# Patient Record
Sex: Male | Born: 2000
Health system: Southern US, Community
[De-identification: ages and names within clinical notes are randomized; demographics above are authoritative.]

---
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
## 2022-01-10 ENCOUNTER — Other Ambulatory Visit: Payer: Self-pay

## 2022-01-10 ENCOUNTER — Encounter (HOSPITAL_COMMUNITY): Payer: Self-pay

## 2022-01-10 ENCOUNTER — Emergency Department (HOSPITAL_COMMUNITY)
Admission: EM | Admit: 2022-01-10 | Discharge: 2022-01-10 | Disposition: A | Discharge status: Home / Self Care | Payer: No Typology Code available for payment source | Attending: Emergency Medicine | Admitting: Emergency Medicine

## 2022-01-10 DIAGNOSIS — R519 Headache, unspecified: Secondary | ICD-10-CM | POA: Diagnosis not present

## 2022-01-10 DIAGNOSIS — R509 Fever, unspecified: Secondary | ICD-10-CM | POA: Insufficient documentation

## 2022-01-10 DIAGNOSIS — M791 Myalgia, unspecified site: Secondary | ICD-10-CM | POA: Diagnosis not present

## 2022-01-10 DIAGNOSIS — Z20822 Contact with and (suspected) exposure to covid-19: Secondary | ICD-10-CM | POA: Insufficient documentation

## 2022-01-10 DIAGNOSIS — R059 Cough, unspecified: Secondary | ICD-10-CM | POA: Insufficient documentation

## 2022-01-10 DIAGNOSIS — R6889 Other general symptoms and signs: Secondary | ICD-10-CM

## 2022-01-10 LAB — SARS CORONAVIRUS 2 BY RT PCR: SARS Coronavirus 2 by RT PCR: NEGATIVE

## 2022-01-10 MED ORDER — ACETAMINOPHEN 500 MG PO TABS
1000.0000 mg | ORAL_TABLET | Freq: Once | ORAL | Status: AC
  Administered 2022-01-10: 1000 mg via ORAL
  Filled 2022-01-10: qty 2

## 2022-01-10 NOTE — Discharge Instructions (Addendum)
You were seen in the emergency department today for fever and headache.  Make sure that you are drinking lots of fluids and getting plenty of rest. You can take decongestants as long as you take them with lots of water.   Please use Tylenol or ibuprofen for fever/pain.  You may use 800 mg ibuprofen every 6 hours or 1000 mg of Tylenol every 6 hours.  You may choose to alternate between the 2.  This would be most effective.  Do not exceed 4 g of Tylenol within 24 hours.  Do not exceed 3200 mg ibuprofen within 24 hours.  Example: 8:30pm - 1000mg  tylenol 11:30pm - 800mg  ibuprofen 2:30am - 1000mg  tylenol 5:30am - 800mg  ibuprofen  Continue to monitor how you are doing, and return to the emergency department for new or worsening symptoms such as chest pain, difficulty breathing not related to coughing, fever despite medication, or persistent vomiting or diarrhea.

## 2022-01-10 NOTE — ED Provider Notes (Signed)
[facility] DEPT Provider Note   CSN: [id number] Arrival date & time: 01/10/22  1933     History  Chief Complaint  Patient presents with   Fever   Generalized Body Aches   Chills    Danny Best is a 21 y.o. male who presents emergency department complaining of fever, generalized body aches, and headache for the past 2 days.  Mother notes patient had a temperature of 102.27F prior to ER arrival.  Patient works at [employer], and states that he was working outside for several hours without a jacket today.  Believes that this made his symptoms worse.  Mother was giving him ibuprofen and Tylenol, as well as Mucinex.  Patient reports minimally productive cough, no shortness of breath.  No nausea, vomiting, abdominal pain, diarrhea.   Fever Associated symptoms: cough and headaches   Associated symptoms: no chest pain, no diarrhea, no nausea and no vomiting       Home Medications Prior to Admission medications   Medication Sig Start Date End Date Taking? Authorizing Provider  flintstones complete (FLINTSTONES) 60 MG chewable tablet Chew 1 tablet by mouth daily.    [provider]  ibuprofen (ADVIL,MOTRIN) 100 MG/5ML suspension Take 5 mg/kg by mouth every 6 (six) hours as needed.    [provider]  Multiple Vitamin (DAILY VITAMINS PO) Take by mouth.    [provider]  ondansetron (ZOFRAN ODT) 4 MG disintegrating tablet Take 1 tablet (4 mg total) by mouth every 8 (eight) hours as needed for nausea or vomiting. 09/23/13   Antonietta Breach, PA-C      Allergies    Patient has no known allergies.    Review of Systems   Review of Systems  Constitutional:  Positive for fever.  Eyes:  Negative for visual disturbance.  Respiratory:  Positive for cough. Negative for shortness of breath.   Cardiovascular:  Negative for chest pain.  Gastrointestinal:  Negative for abdominal pain, diarrhea, nausea and vomiting.  Neurological:  Positive for  headaches. Negative for syncope.  All other systems reviewed and are negative.  Physical Exam Updated Vital Signs BP 115/60   Pulse (!) 111   Temp (!) 102.1 F (38.9 C) (Oral)   Resp 17   Ht 6\' 2"  (1.88 m)   Wt 79.5 kg   SpO2 94%   BMI 22.51 kg/m  Physical Exam Vitals and nursing note reviewed.  Constitutional:      Appearance: Normal appearance. He is not toxic-appearing.  HENT:     Head: Normocephalic and atraumatic.  Eyes:     Conjunctiva/sclera: Conjunctivae normal.  Cardiovascular:     Rate and Rhythm: Normal rate and regular rhythm.  Pulmonary:     Effort: Pulmonary effort is normal. No respiratory distress.     Breath sounds: Normal breath sounds.  Abdominal:     General: There is no distension.     Palpations: Abdomen is soft.     Tenderness: There is no abdominal tenderness.  Musculoskeletal:     Cervical back: Normal range of motion. No rigidity.  Skin:    General: Skin is warm and dry.  Neurological:     General: No focal deficit present.     Mental Status: He is alert.  Psychiatric:        Mood and Affect: Mood normal.        Behavior: Behavior normal.    ED Results / Procedures / Treatments   Labs (all labs ordered are listed, but only  abnormal results are displayed) Labs Reviewed  SARS CORONAVIRUS 2 BY RT PCR    EKG None  Radiology No results found.  Procedures Procedures    Medications Ordered in ED Medications  acetaminophen (TYLENOL) tablet 1,000 mg (1,000 mg Oral Given 01/10/22 2005)    ED Course/ Medical Decision Making/ A&P                           Medical Decision Making Risk OTC drugs.  This patient is a 21 y.o. male  who presents to the ED for concern of headache and fever.   Differential diagnoses prior to evaluation: The emergent differential diagnosis includes, but is not limited to,  Upper respiratory infection, acute sinusitis, acute otitis media, strep pharyngitis, bronchiolitis/RSV, influenza, COVID, pneumonia,  appendicitis, meningitis. This is not an exhaustive differential.   Past Medical History / Co-morbidities: History reviewed. No pertinent past medical history.  Physical Exam: Physical exam performed. The pertinent findings include: Febrile to 102.27F, given Tylenol.  Tachycardic to 111.  Normal oxygen saturation.  Lung sounds clear.  Patient does not appear toxic.  Lab Tests/Imaging studies: I Ordered, and personally interpreted labs/imaging including COVID test.  The pertinent results include: Negative for COVID.    Medications: I ordered medication including Tylenol for fever.  I have reviewed the patients home medicines and have made adjustments as needed.   Disposition: After consideration of the diagnostic results and the patients response to treatment, I feel that patient's not requiring admission or inpatient treatment for symptoms.  His symptoms are likely related to a viral illness.  He tested negative for COVID.  Low suspicion for meningitis as he is clinically well appearing, full range of motion of neck without stiffness or pain.  Will recommend symptomatic management with over-the-counter medications.  Discussed reasons to return to the emergency department, and the mother is agreeable to the plan.         Final Clinical Impression(s) / ED Diagnoses Final diagnoses:  Flu-like symptoms  Fever, unspecified fever cause    Rx / DC Orders ED Discharge Orders     None      Portions of this report may have been transcribed using voice recognition software. Every effort was made to ensure accuracy; however, inadvertent computerized transcription errors may be present.    Estill Cotta 01/10/22 2307    Daleen Bo, MD 01/10/22 2352

## 2022-01-10 NOTE — ED Triage Notes (Addendum)
Pt reports with fever, generalized body aches, and fever x 2 days. Pt last took night time Mucinex 1.5 hrs ago.

## 2023-08-15 ENCOUNTER — Telehealth: Payer: Self-pay

## 2023-08-15 ENCOUNTER — Ambulatory Visit
Admission: EM | Admit: 2023-08-15 | Discharge: 2023-08-15 | Disposition: A | Discharge status: Home / Self Care | Payer: BC Managed Care – PPO | Attending: Physician Assistant | Admitting: Physician Assistant

## 2023-08-15 DIAGNOSIS — K047 Periapical abscess without sinus: Secondary | ICD-10-CM | POA: Diagnosis not present

## 2023-08-15 DIAGNOSIS — R220 Localized swelling, mass and lump, head: Secondary | ICD-10-CM | POA: Diagnosis not present

## 2023-08-15 MED ORDER — IBUPROFEN 600 MG PO TABS: 600.0000 mg | ORAL_TABLET | Freq: Three times a day (TID) | ORAL | 0 refills | Status: DC | PRN

## 2023-08-15 MED ORDER — AMOXICILLIN-POT CLAVULANATE 875-125 MG PO TABS: 1.0000 | ORAL_TABLET | Freq: Two times a day (BID) | ORAL | 0 refills | Status: DC

## 2023-08-15 NOTE — Discharge Instructions (Signed)
Start Augmentin twice daily for 7 days.  Use ibuprofen 600 mg up to 3 times a day for pain relief.  You should avoid NSAIDs including aspirin, ibuprofen/Advil, naproxen/Aleve with this medication as it causes stomach bleeding.  You can use Tylenol for breakthrough pain.  Gargle with warm salt water for additional symptom relief.  You should follow-up with dentist; call to schedule an appointment.  If you develop any worsening symptoms including difficulty swallowing, difficulty speaking, swelling of your throat, high fever, change in your voice you need to be seen immediately.

## 2023-08-15 NOTE — ED Triage Notes (Signed)
Patient reports right sided facial swelling that he noticed this morning. States there is a dull pain but he just wanted to make sure it wasn't an infection.

## 2023-08-15 NOTE — ED Provider Notes (Signed)
[facility]    CSN: [id number] Arrival date & time: 08/15/23  4098      History   Chief Complaint Chief Complaint  Patient presents with   Facial Swelling    HPI Danny Best is a 22 y.o. male.   Patient presents today companied by his mother help provide the majority of history.  Reports he woke up today and noticed swelling on his right lower jaw.  He reports some discomfort in this area that is rated 3/4 on a 0-10 pain scale but denies any significant pain.  He denies any difficulty with mastication, difficulty opening his mouth, sore throat, fever, change to saliva production.  He has not tried any over-the-counter medication.  Denies any recent dental procedure.  He is eating and drinking normally.  He was treated with azithromycin for pneumonia a few months ago but denies additional antibiotics since that time.  He denies any swelling of his throat, shortness of breath, muffled voice.  He reports being up-to-date on his childhood vaccinations.  No concern for HIV exposure.    History reviewed. No pertinent past medical history.  There are no active problems to display for this patient.   History reviewed. No pertinent surgical history.     Home Medications    Prior to Admission medications   Medication Sig Start Date End Date Taking? Authorizing Provider  amoxicillin-clavulanate (AUGMENTIN) 875-125 MG tablet Take 1 tablet by mouth every 12 (twelve) hours. 08/15/23  Yes Saw Mendenhall K, PA-C  ibuprofen (ADVIL) 600 MG tablet Take 1 tablet (600 mg total) by mouth every 8 (eight) hours as needed. 08/15/23  Yes Erubiel Manasco, Noberto Retort, PA-C    Family History Family History  Problem Relation Age of Onset   Healthy Mother    Healthy Father     Social History Social History   Tobacco Use   Smoking status: Never   Smokeless tobacco: Never  Vaping Use   Vaping status: Never Used  Substance Use Topics   Alcohol use: No   Drug use: No     Allergies    Patient has no known allergies.   Review of Systems Review of Systems  Constitutional:  Positive for activity change. Negative for appetite change, fatigue and fever.  HENT:  Positive for facial swelling. Negative for congestion, dental problem, sinus pressure, sneezing, sore throat, trouble swallowing and voice change.   Respiratory:  Negative for cough (Improving since pneumonia) and shortness of breath.   Cardiovascular:  Negative for chest pain.  Gastrointestinal:  Negative for abdominal pain, diarrhea, nausea and vomiting.     Physical Exam Triage Vital Signs ED Triage Vitals [08/15/23 1113]  Encounter Vitals Group     BP 112/62     Systolic BP Percentile      Diastolic BP Percentile      Pulse Rate 83     Resp 16     Temp 98.2 F (36.8 C)     Temp Source Oral     SpO2 98 %     Weight      Height      Head Circumference      Peak Flow      Pain Score 2     Pain Loc      Pain Education      Exclude from Growth Chart    No data found.  Updated Vital Signs BP 112/62 (BP Location: Right Arm)   Pulse 83   Temp 98.2 F (36.8 C) (  Oral)   Resp 16   SpO2 98%   Visual Acuity Right Eye Distance:   Left Eye Distance:   Bilateral Distance:    Right Eye Near:   Left Eye Near:    Bilateral Near:     Physical Exam Vitals reviewed.  Constitutional:      General: He is awake.     Appearance: Normal appearance. He is well-developed. He is not ill-appearing.     Comments: Very pleasant male appears stated age in no acute distress sitting comfortably in exam room  HENT:     Head: Normocephalic and atraumatic.     Jaw: Swelling present.      Right Ear: Tympanic membrane, ear canal and external ear normal. Tympanic membrane is not erythematous or bulging.     Left Ear: Tympanic membrane, ear canal and external ear normal. Tympanic membrane is not erythematous or bulging.     Nose: Nose normal.     Mouth/Throat:     Dentition: Gingival swelling present. No dental  abscesses.     Pharynx: Uvula midline. No oropharyngeal exudate, posterior oropharyngeal erythema or uvula swelling.     Comments: No evidence of Ludwig angina Cardiovascular:     Rate and Rhythm: Normal rate and regular rhythm.     Heart sounds: Normal heart sounds, S1 normal and S2 normal. No murmur heard. Pulmonary:     Effort: Pulmonary effort is normal. No accessory muscle usage or respiratory distress.     Breath sounds: Normal breath sounds. No stridor. No wheezing, rhonchi or rales.     Comments: Clear to auscultation bilaterally Neurological:     Mental Status: He is alert.  Psychiatric:        Behavior: Behavior is cooperative.      UC Treatments / Results  Labs (all labs ordered are listed, but only abnormal results are displayed) Labs Reviewed - No data to display  EKG   Radiology No results found.  Procedures Procedures (including critical care time)  Medications Ordered in UC Medications - No data to display  Initial Impression / Assessment and Plan / UC Course  I have reviewed the triage vital signs and the nursing notes.  Pertinent labs & imaging results that were available during my care of the patient were reviewed by me and considered in my medical decision making (see chart for details).     Patient is well-appearing, afebrile, nontoxic, nontachycardic.  Concern for dental infection given clinical presentation.  Patient was started on Augmentin twice daily for 7 days.  He was given ibuprofen 600 mg to be taken up to 3 times a day as needed with instruction not to take NSAIDs with this medication due to risk of GI bleeding.  He can use warm compresses on this area.  Recommended follow-up with a dentist and he was given the contact information for local provider.  Discussed that if anything worsens or changes he needs to be seen immediately including swelling of his throat, shortness of breath, dysphagia, muffled voice.  Strict return precautions given.   Excuse note provided.  Final Clinical Impressions(s) / UC Diagnoses   Final diagnoses:  Dental infection  Swelling of right side of face     Discharge Instructions      Start Augmentin twice daily for 7 days.  Use ibuprofen 600 mg up to 3 times a day for pain relief.  You should avoid NSAIDs including aspirin, ibuprofen/Advil, naproxen/Aleve with this medication as it causes stomach bleeding.  You  can use Tylenol for breakthrough pain.  Gargle with warm salt water for additional symptom relief.  You should follow-up with dentist; call to schedule an appointment.  If you develop any worsening symptoms including difficulty swallowing, difficulty speaking, swelling of your throat, high fever, change in your voice you need to be seen immediately.      ED Prescriptions     Medication Sig Dispense Auth. Provider   amoxicillin-clavulanate (AUGMENTIN) 875-125 MG tablet Take 1 tablet by mouth every 12 (twelve) hours. 14 tablet Adely Facer K, PA-C   ibuprofen (ADVIL) 600 MG tablet Take 1 tablet (600 mg total) by mouth every 8 (eight) hours as needed. 30 tablet Rodric Punch, Noberto Retort, PA-C      PDMP not reviewed this encounter.   Jeani Hawking, PA-C 08/15/23 1139

## 2023-10-10 ENCOUNTER — Ambulatory Visit (HOSPITAL_COMMUNITY)
Admission: EM | Admit: 2023-10-10 | Discharge: 2023-10-10 | Disposition: A | Discharge status: Home / Self Care | Payer: BC Managed Care – PPO

## 2024-01-24 ENCOUNTER — Ambulatory Visit
Admission: RE | Admit: 2024-01-24 | Discharge: 2024-01-24 | Disposition: A | Discharge status: Home / Self Care | Source: Ambulatory Visit | Attending: Student in an Organized Health Care Education/Training Program | Admitting: Student in an Organized Health Care Education/Training Program

## 2024-01-24 ENCOUNTER — Ambulatory Visit: Admitting: Student in an Organized Health Care Education/Training Program

## 2024-01-24 ENCOUNTER — Encounter: Payer: Self-pay | Admitting: Student in an Organized Health Care Education/Training Program

## 2024-01-24 VITALS — BP 120/80 | HR 71 | Temp 98.9°F | Ht 73.5 in | Wt 183.4 lb

## 2024-01-24 DIAGNOSIS — R059 Cough, unspecified: Secondary | ICD-10-CM

## 2024-01-24 DIAGNOSIS — R053 Chronic cough: Secondary | ICD-10-CM

## 2024-01-24 DIAGNOSIS — R0602 Shortness of breath: Secondary | ICD-10-CM

## 2024-01-24 LAB — NITRIC OXIDE: Nitric Oxide: 28

## 2024-01-24 NOTE — Progress Notes (Unsigned)
 Synopsis: Referred in for cough by Lyman Sander, MD  Assessment & Plan:   #Cough  Presents for the evaluation of a mild cough with a recent history of a few URTI's, one of which was treated with azithromycin. He has a normal physical exam today. In light of reported history of childhood asthma, we obtained a FENO in clinic today that was indeterminate at 28 ppb. We will need to obtain PFT's to further work him up for cough variant asthma. His only other risk factor includes the presence of a pet parrot at home, which does raise the spectre of hypersensitivity pneumonitis.   I will obtain a CXR today to rule out pneumonia and pneumothorax. I will also review his PFT's (specifically DLCO) for any signs of reduced diffusion capacity once obtained. Should there be any signal of abnormality on PFT's, I will proceed with a high resolution chest CT to further evaluate the lung parenchyma.  - Nitric oxide > Indeterminate at 28 ppb - Pulmonary Function Test; Future - DG Chest 2 View; Future > reviewed, no acute findings - recommended to avoid the parrot at home  Return in about 4 weeks (around 02/21/2024).  I spent 60 minutes caring for this patient today, including preparing to see the patient, obtaining a medical history , reviewing a separately obtained history, performing a medically appropriate examination and/or evaluation, counseling and educating the patient/family/caregiver, ordering medications, tests, or procedures, documenting clinical information in the electronic health record, and independently interpreting results (not separately reported/billed) and communicating results to the patient/family/caregiver  Vergia Glasgow, MD [facility]  End of visit medications:  No orders of the defined types were placed in this encounter.   No current outpatient medications on file.   Subjective:   PATIENT ID: Danny Best GENDER: male DOB: May 20, 2001, MRN:  [id number]  Chief Complaint  Patient presents with   New Patient (Initial Visit)    Has had a lung condition for about 2-3 yrs, had had multiple lung infections -- flu, Covid, pneumonia, bronchitis. Has a wet cough with yellow phlegm, intermittent wheezing. Has had x-ray's in the past, andf has tried an inhaler but that wasn't helpful at the time. Is not currently SOB/DOE but can get winded or tried during sports. Remote history of childhood asthma.     HPI  Patient is a pleasant 23 year old male presenting to clinic for the evaluation of a mild cough.  He reports a mild cough that is mostly nocturnal without any sputum production.  This is a new symptom for him and has not been present in the past.  He does not have any other associated pulmonary symptoms and specifically denies exertional dyspnea or any limitation to his activity.  He does tell me that he had some exertional dyspnea growing up as a child but this self resolved.  He does not have any chest pain or chest tightness, denies any fevers, chills, night sweats, or signs of systemic illness. He is able to exercise without any limitations.  Patient was told he has asthma as a child but did not use any inhalers.  He was told he outgrew it.  Review of the medical record is notable for a visit with pediatrics in October 2020 for where he was treated with a course of azithromycin for pneumonia.  He was then at again seen in clinic in February 2025 for symptoms that were consistent with an upper respiratory tract infection that was treated conservatively. There is mention of recurrent  URTI's in the chart.  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  They have a family dog as well as a family parent.  The parent is in the living room and his exposure to it is minimal.  He denies any smoking history and denies using any vapes.  He does not have any occupational exposures.  He is aspiring to be a Advertising account executive.  Ancillary information including prior medications, full medical/surgical/family/social histories, and PFTs (when available) are listed below and have been reviewed.   Review of Systems  Constitutional:  Negative for chills and fever.  Respiratory:  Positive for cough. Negative for hemoptysis, sputum production, shortness of breath and wheezing.   Cardiovascular:  Negative for chest pain.     Objective:   Vitals:   01/24/24 1519  BP: 120/80  Pulse: 71  Temp: 98.9 F (37.2 C)  TempSrc: Oral  SpO2: 98%  Weight: 183 lb 6.4 oz (83.2 kg)  Height: 6' 1.5 (1.867 m)   98% on RA BMI Readings from Last 3 Encounters:  01/24/24 23.87 kg/m  01/10/22 22.51 kg/m (55%, Z= 0.12)*   * Growth percentiles are based on CDC (Boys, 2-20 Years) data.   Wt Readings from Last 3 Encounters:  01/24/24 183 lb 6.4 oz (83.2 kg)  01/10/22 175 lb 4.8 oz (79.5 kg) (81%, Z= 0.88)*  10/27/20 154 lb 8 oz (70.1 kg) (66%, Z= 0.41)*   * Growth percentiles are based on CDC (Boys, 2-20 Years) data.    Physical Exam Constitutional:      Appearance: Normal appearance.  HENT:     Head: Normocephalic and atraumatic.  Cardiovascular:     Rate and Rhythm: Normal rate and regular rhythm.     Pulses: Normal pulses.     Heart sounds: Normal heart sounds.  Pulmonary:     Effort: Pulmonary effort is normal. No respiratory distress.     Breath sounds: Normal breath sounds. No wheezing, rhonchi or rales.  Musculoskeletal:     Cervical back: Normal range of motion.  Neurological:     General: No focal deficit present.     Mental Status: He is alert and oriented to person, place, and time. Mental status is at baseline.       Ancillary Information    History reviewed. No pertinent past medical history.   Family History  Problem Relation Age of Onset   Healthy Mother    Healthy Father      History reviewed. No pertinent surgical history.  Social History   Socioeconomic History   Marital  status: Single    Spouse name: Not on file   Number of children: Not on file   Years of education: Not on file   Highest education level: Not on file  Occupational History   Not on file  Tobacco Use   Smoking status: Never   Smokeless tobacco: Never  Vaping Use   Vaping status: Never Used  Substance and Sexual Activity   Alcohol use: No   Drug use: No   Sexual activity: Not on file  Other Topics Concern   Not on file  Social History Narrative   Not on file   Social Drivers of Health   Financial Resource Strain: Not on file  Food Insecurity: Low Risk  (10/19/2023)   Received from [facility]   Hunger Vital Sign    Worried About Running Out of Food in the Last Year: Never true  Ran Out of Food in the Last Year: Never true  Transportation Needs: No Transportation Needs (10/19/2023)   Received from [facility]    In the past 12 months, has lack of reliable transportation kept you from medical appointments, meetings, work or from getting things needed for daily living? : No  Physical Activity: Not on file  Stress: Not on file  Social Connections: Not on file  Intimate Partner Violence: Not on file     No Known Allergies   CBC No results found for: WBC, RBC, HGB, HCT, PLT, MCV, MCH, MCHC, RDW, LYMPHSABS, MONOABS, EOSABS, BASOSABS  Pulmonary Functions Testing Results:     No data to display          Outpatient Medications Prior to Visit  Medication Sig Dispense Refill   amoxicillin -clavulanate (AUGMENTIN ) 875-125 MG tablet Take 1 tablet by mouth every 12 (twelve) hours. 14 tablet 0   ibuprofen  (ADVIL ) 600 MG tablet Take 1 tablet (600 mg total) by mouth every 8 (eight) hours as needed. 30 tablet 0   No facility-administered medications prior to visit.

## 2024-01-25 ENCOUNTER — Ambulatory Visit: Payer: Self-pay | Admitting: Student in an Organized Health Care Education/Training Program

## 2024-01-25 NOTE — Telephone Encounter (Signed)
 Copied from CRM [id number]. Topic: Clinical - Lab/Test Results >> Jan 25, 2024 12:13 PM Danny Best wrote: Reason for CRM: Patient would like to discuss his xray results, has additional questions.

## 2024-02-16 ENCOUNTER — Encounter

## 2024-02-23 ENCOUNTER — Ambulatory Visit: Admitting: Student in an Organized Health Care Education/Training Program

## 2024-03-15 ENCOUNTER — Ambulatory Visit: Admitting: Student in an Organized Health Care Education/Training Program

## 2024-05-15 ENCOUNTER — Ambulatory Visit: Admitting: Student in an Organized Health Care Education/Training Program

## 2024-05-15 ENCOUNTER — Encounter: Payer: Self-pay | Admitting: Student in an Organized Health Care Education/Training Program

## 2024-05-15 VITALS — BP 132/82 | HR 86 | Temp 97.6°F | Ht 73.5 in | Wt 187.0 lb

## 2024-05-15 DIAGNOSIS — R0602 Shortness of breath: Secondary | ICD-10-CM

## 2024-05-15 DIAGNOSIS — R053 Chronic cough: Secondary | ICD-10-CM | POA: Diagnosis not present

## 2024-05-15 LAB — PULMONARY FUNCTION TEST
DL/VA % pred: 108 %
DL/VA: 5.62 ml/min/mmHg/L
DLCO unc % pred: 85 %
DLCO unc: 31.37 ml/min/mmHg
FEF 25-75 Post: 5.88 L/s
FEF 25-75 Pre: 4.93 L/s
FEF2575-%Change-Post: 19 %
FEF2575-%Pred-Post: 110 %
FEF2575-%Pred-Pre: 92 %
FEV1-%Change-Post: 3 %
FEV1-%Pred-Post: 84 %
FEV1-%Pred-Pre: 81 %
FEV1-Post: 4.36 L
FEV1-Pre: 4.21 L
FEV1FVC-%Change-Post: 3 %
FEV1FVC-%Pred-Pre: 100 %
FEV6-%Change-Post: 0 %
FEV6-%Pred-Post: 80 %
FEV6-%Pred-Pre: 80 %
FEV6-Post: 5.01 L
FEV6-Pre: 4.99 L
FEV6FVC-%Pred-Post: 100 %
FEV6FVC-%Pred-Pre: 100 %
FVC-%Change-Post: 0 %
FVC-%Pred-Post: 80 %
FVC-%Pred-Pre: 80 %
FVC-Post: 5.01 L
FVC-Pre: 4.99 L
Post FEV1/FVC ratio: 87 %
Post FEV6/FVC ratio: 100 %
Pre FEV1/FVC ratio: 84 %
Pre FEV6/FVC Ratio: 100 %
RV % pred: 138 %
RV: 2.23 L
TLC % pred: 90 %
TLC: 6.85 L

## 2024-05-15 MED ORDER — LORATADINE 10 MG PO TABS: 10.0000 mg | ORAL_TABLET | Freq: Every day | ORAL | 11 refills | Status: AC

## 2024-05-15 NOTE — Patient Instructions (Signed)
 Full PFT completed today ? ?

## 2024-05-15 NOTE — Progress Notes (Signed)
 Full PFT completed today ? ?

## 2024-05-15 NOTE — Progress Notes (Signed)
 Assessment & Plan:   1. Chronic cough (Primary)  He's had his PFT's that don't show any sign of obstruction. While the results are within normal, DLCO and TLC are closer to the lower limit of normal (especially DLCO at 85% predicted). I will obtain a HRCT to rule out ILD/Hypersensitivity Pneumonitis given exposure to birds growing up. Furthermore, given cough is mostly nocturnal, I will empirically treat for UACS and re-assess for symptomatic improvement on follow up. Should symptoms persist and his CT be normal, I will consider a methacholine challenge test to rule out asthma.  - CT CHEST HIGH RESOLUTION; Future - loratadine (CLARITIN) 10 MG tablet; Take 1 tablet (10 mg total) by mouth daily.  Dispense: 30 tablet; Refill: 11   Return in about 4 months (around 09/14/2024).  I spent 30 minutes caring for this patient today, including preparing to see the patient, obtaining a medical history , reviewing a separately obtained history, performing a medically appropriate examination and/or evaluation, counseling and educating the patient/family/caregiver, ordering medications, tests, or procedures, documenting clinical information in the electronic health record, and independently interpreting results (not separately reported/billed) and communicating results to the patient/family/caregiver  Danny November, MD [facility]   End of visit medications:  Meds ordered this encounter  Medications   loratadine (CLARITIN) 10 MG tablet    Sig: Take 1 tablet (10 mg total) by mouth daily.    Dispense:  30 tablet    Refill:  11     Current Outpatient Medications:    loratadine (CLARITIN) 10 MG tablet, Take 1 tablet (10 mg total) by mouth daily., Disp: 30 tablet, Rfl: 11   Subjective:   PATIENT ID: Danny Best GENDER: male DOB: 21-Jun-2001, MRN: [id number]  Chief Complaint  Patient presents with   Shortness of Breath    Dry cough during the day and cough with phlegm in  the evening.     HPI  Patient is a pleasant 23 year old male presenting for follow up.  Initial Visit 01/24/2024:   He reports a mild cough that is mostly nocturnal without any sputum production.  This is a new symptom for him and has not been present in the past.  He does not have any other associated pulmonary symptoms and specifically denies exertional dyspnea or any limitation to his activity.  He does tell me that he had some exertional dyspnea growing up as a child but this self resolved.  He does not have any chest pain or chest tightness, denies any fevers, chills, night sweats, or signs of systemic illness. He is able to exercise without any limitations.   Patient was told he has asthma as a child but did not use any inhalers.  He was told he outgrew it.  Review of the medical record is notable for a visit with pediatrics in October 2020 for where he was treated with a course of azithromycin for pneumonia.  He was then at again seen in clinic in February 2025 for symptoms that were consistent with an upper respiratory tract infection that was treated conservatively. There is mention of recurrent URTI's in the chart.  Return Visit 05/15/24:  He continues to report a cough that is mostly nocturnal. He denies any shortness of breath. He does not produce sputum, and denies having any chest pain or chest tightness. No wheezing is reported. He's moved to [employer] since our last visit. He had his PFT's and is here to discuss results.  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 He was previously exposed to the family dog and family parrot. He denies any smoking history and denies using any vapes.  He does not have any occupational exposures.  He is aspiring to be a Psychologist, occupational.  Ancillary information including prior medications, full medical/surgical/family/social histories, and PFTs (when available) are listed below and have been reviewed.    Review of Systems   Constitutional:  Negative for chills and fever.  Respiratory:  Positive for cough. Negative for hemoptysis, sputum production, shortness of breath and wheezing.   Cardiovascular:  Negative for chest pain.     Objective:   Vitals:   05/15/24 1050  BP: 132/82  Pulse: 86  Temp: 97.6 F (36.4 C)  TempSrc: Temporal  SpO2: 97%  Weight: 187 lb (84.8 kg)  Height: 6' 1.5 (1.867 m)   97% on RA  BMI Readings from Last 3 Encounters:  05/15/24 24.34 kg/m  05/15/24 24.44 kg/m  01/24/24 23.87 kg/m   Wt Readings from Last 3 Encounters:  05/15/24 187 lb (84.8 kg)  05/15/24 187 lb 12.8 oz (85.2 kg)  01/24/24 183 lb 6.4 oz (83.2 kg)    Physical Exam Constitutional:      Appearance: Normal appearance.  HENT:     Head: Normocephalic and atraumatic.  Cardiovascular:     Rate and Rhythm: Normal rate and regular rhythm.     Pulses: Normal pulses.     Heart sounds: Normal heart sounds.  Pulmonary:     Effort: Pulmonary effort is normal. No respiratory distress.     Breath sounds: Normal breath sounds. No wheezing, rhonchi or rales.  Musculoskeletal:     Cervical back: Normal range of motion.  Neurological:     General: No focal deficit present.     Mental Status: He is alert and oriented to person, place, and time. Mental status is at baseline.       Ancillary Information    No past medical history on file.   Family History  Problem Relation Age of Onset   Healthy Mother    Healthy Father      No past surgical history on file.  Social History   Socioeconomic History   Marital status: Single    Spouse name: Not on file   Number of children: Not on file   Years of education: Not on file   Highest education level: Not on file  Occupational History   Not on file  Tobacco Use   Smoking status: Never   Smokeless tobacco: Never  Vaping Use   Vaping status: Never Used  Substance and Sexual Activity   Alcohol use: No   Drug use: No   Sexual activity: Not on  file  Other Topics Concern   Not on file  Social History Narrative   Not on file   Social Drivers of Health   Financial Resource Strain: Not on file  Food Insecurity: Low Risk  (10/19/2023)   Received from [facility]   Hunger Vital Sign    Within the past 12 months, you worried that your food would run out before you got money to buy more: Never true    Within the past 12 months, the food you bought just didn't last and you didn't have money to get more. : Never true  Transportation Needs: No Transportation Needs (10/19/2023)   Received from [facility]    In the past 12 months, has lack of reliable transportation  kept you from medical appointments, meetings, work or from getting things needed for daily living? : No  Physical Activity: Not on file  Stress: Not on file  Social Connections: Not on file  Intimate Partner Violence: Not on file     No Known Allergies   CBC No results found for: WBC, RBC, HGB, HCT, PLT, MCV, MCH, MCHC, RDW, LYMPHSABS, MONOABS, EOSABS, BASOSABS  Pulmonary Functions Testing Results:    Latest Ref Rng & Units 05/15/2024    8:08 AM  PFT Results  FVC-Pre L 4.99   FVC-Predicted Pre % 80   FVC-Post L 5.01   FVC-Predicted Post % 80   Pre FEV1/FVC % % 84   Post FEV1/FCV % % 87   FEV1-Pre L 4.21   FEV1-Predicted Pre % 81   FEV1-Post L 4.36   DLCO uncorrected ml/min/mmHg 31.37   DLCO UNC% % 85   DLVA Predicted % 108   TLC L 6.85   TLC % Predicted % 90   RV % Predicted % 138     No outpatient medications prior to visit.   No facility-administered medications prior to visit.

## 2024-05-22 ENCOUNTER — Ambulatory Visit
Admission: RE | Admit: 2024-05-22 | Discharge: 2024-05-22 | Disposition: A | Discharge status: Home / Self Care | Source: Ambulatory Visit | Attending: Pediatrics | Admitting: Pediatrics

## 2024-05-22 DIAGNOSIS — R053 Chronic cough: Secondary | ICD-10-CM | POA: Diagnosis present

## 2024-06-07 ENCOUNTER — Telehealth: Payer: Self-pay | Admitting: Student in an Organized Health Care Education/Training Program

## 2024-06-07 ENCOUNTER — Encounter: Payer: Self-pay | Admitting: Student in an Organized Health Care Education/Training Program

## 2024-06-07 ENCOUNTER — Ambulatory Visit (INDEPENDENT_AMBULATORY_CARE_PROVIDER_SITE_OTHER): Admitting: Student in an Organized Health Care Education/Training Program

## 2024-06-07 VITALS — BP 112/74 | HR 60 | Temp 97.6°F | Ht 73.5 in | Wt 185.2 lb

## 2024-06-07 DIAGNOSIS — R053 Chronic cough: Secondary | ICD-10-CM

## 2024-06-07 DIAGNOSIS — R0602 Shortness of breath: Secondary | ICD-10-CM

## 2024-06-07 DIAGNOSIS — J479 Bronchiectasis, uncomplicated: Secondary | ICD-10-CM | POA: Diagnosis not present

## 2024-06-07 NOTE — Progress Notes (Signed)
 Assessment & Plan:   #Bronchiectasis without complication (HCC) (Primary)  His initial presentation was for persistent cough, with initial workup that included a PFT that on the surface was within normal, though DLCO and TLC were close to the lower limit of normal. I did obtain a high resolution chest CT that is notable for bronchiectasis, worse in the RML and in the RLL.  Differential for this includes cystic fibrosis, primary ciliary dyskinesis, common variable immune deficiency, and post infectious   Given he resides in [place], and will require more longitudinal care as well as a workup for cystic fibrosis, I will refer him to the CF center in [place]. I would like for him to be worked up for CF, PCD, Young syndrome, and CGM/CVID. Referring to CF center as we are unable to perform as sweat chloride test in our clinic. Should this be negative, and he be referred back to me, I would complete workup with immune globulin levels (IgM/IgG/IgE), Alpha-1 levels, and RF. He would also benefit from respiratory cultures and consideration of a course of antibiotics and possible Bernsocatib.   Return in about 6 months (around 12/06/2024).  Belva November, MD [facility]  I spent 35 minutes caring for this patient today, including preparing to see the patient, obtaining a medical history , reviewing a separately obtained history, performing a medically appropriate examination and/or evaluation, counseling and educating the patient/family/caregiver, ordering medications, tests, or procedures, documenting clinical information in the electronic health record, and independently interpreting results (not separately reported/billed) and communicating results to the patient/family/caregiver  End of visit medications:  No orders of the defined types were placed in this encounter.    Current Outpatient Medications:    loratadine (CLARITIN) 10 MG tablet, Take 1 tablet (10 mg total)  by mouth daily., Disp: 30 tablet, Rfl: 11   Subjective:   PATIENT ID: Danny Best GENDER: male DOB: Aug 24, 2000, MRN: [id number]  Chief Complaint  Patient presents with   Cough    Cough with yellow phlegm. Occasional wheezing.     HPI  Patient is a pleasant 23 year old male presenting for follow up.   Initial Visit 01/24/2024:   He reports a mild cough that is mostly nocturnal without any sputum production.  This is a new symptom for him and has not been present in the past.  He does not have any other associated pulmonary symptoms and specifically denies exertional dyspnea or any limitation to his activity.  He does tell me that he had some exertional dyspnea growing up as a child but this self resolved.  He does not have any chest pain or chest tightness, denies any fevers, chills, night sweats, or signs of systemic illness. He is able to exercise without any limitations.   Patient was told he has asthma as a child but did not use any inhalers.  He was told he outgrew it.  Review of the medical record is notable for a visit with pediatrics in October 2020 for where he was treated with a course of azithromycin for pneumonia.  He was then at again seen in clinic in February 2025 for symptoms that were consistent with an upper respiratory tract infection that was treated conservatively. There is mention of recurrent URTI's in the chart.   Return Visit 05/15/24:   He continues to report a cough that is mostly nocturnal. He denies any shortness of breath. He does not produce sputum, and denies having any chest pain or chest tightness. No wheezing  is reported. He's moved to [place] since our last visit. He had his PFT's and is here to discuss results.  Return Visit 06/07/2024:  Returns for follow up today accompanied by his mother. Continues to have episodes of severe cough that remains unproductive. He has no shortness of breath, chest pain, or chest tightness. Denies any wheezing. He had his  HRCT and is here to discuss results. Mother reports that his symptoms have been ongoing for years.   He is currently a Consulting civil engineer at [employer] and lives in [place]. He was previously exposed to the family dog and family parrot. He denies any smoking history and denies using any vapes.  He does not have any occupational exposures.  He is aspiring to be a Psychologist, occupational.  Ancillary information including prior medications, full medical/surgical/family/social histories, and PFTs (when available) are listed below and have been reviewed.    Review of Systems  Constitutional:  Negative for chills and fever.  Respiratory:  Positive for cough. Negative for hemoptysis, sputum production, shortness of breath and wheezing.      Objective:   Vitals:   06/07/24 1108  BP: 112/74  Pulse: 60  Temp: 97.6 F (36.4 C)  TempSrc: Temporal  SpO2: 97%  Weight: 185 lb 3.2 oz (84 kg)  Height: 6' 1.5 (1.867 m)   97% on RA BMI Readings from Last 3 Encounters:  06/07/24 24.10 kg/m  05/15/24 24.34 kg/m  05/15/24 24.44 kg/m   Wt Readings from Last 3 Encounters:  06/07/24 185 lb 3.2 oz (84 kg)  05/15/24 187 lb (84.8 kg)  05/15/24 187 lb 12.8 oz (85.2 kg)    Physical Exam Constitutional:      Appearance: Normal appearance.  Cardiovascular:     Rate and Rhythm: Normal rate and regular rhythm.     Pulses: Normal pulses.     Heart sounds: Normal heart sounds.  Pulmonary:     Effort: Pulmonary effort is normal.     Breath sounds: Normal breath sounds.  Neurological:     General: No focal deficit present.     Mental Status: He is alert and oriented to person, place, and time. Mental status is at baseline.       Ancillary Information    No past medical history on file.   Family History  Problem Relation Age of Onset   Healthy Mother    Healthy Father      No past surgical history on file.  Social History   Socioeconomic History   Marital status: Single    Spouse name: Not on  file   Number of children: Not on file   Years of education: Not on file   Highest education level: Not on file  Occupational History   Not on file  Tobacco Use   Smoking status: Never   Smokeless tobacco: Never  Vaping Use   Vaping status: Never Used  Substance and Sexual Activity   Alcohol use: No   Drug use: No   Sexual activity: Not on file  Other Topics Concern   Not on file  Social History Narrative   Not on file   Social Drivers of Health   Financial Resource Strain: Not on file  Food Insecurity: Low Risk  (10/19/2023)   Received from [facility]   Hunger Vital Sign    Within the past 12 months, you worried that your food would run out before you got money to buy more: Never true    Within the past  12 months, the food you bought just didn't last and you didn't have money to get more. : Never true  Transportation Needs: No Transportation Needs (10/19/2023)   Received from [facility]    In the past 12 months, has lack of reliable transportation kept you from medical appointments, meetings, work or from getting things needed for daily living? : No  Physical Activity: Not on file  Stress: Not on file  Social Connections: Not on file  Intimate Partner Violence: Not on file     No Known Allergies   CBC No results found for: WBC, RBC, HGB, HCT, PLT, MCV, MCH, MCHC, RDW, LYMPHSABS, MONOABS, EOSABS, BASOSABS  Pulmonary Functions Testing Results:    Latest Ref Rng & Units 05/15/2024    8:08 AM  PFT Results  FVC-Pre L 4.99   FVC-Predicted Pre % 80   FVC-Post L 5.01   FVC-Predicted Post % 80   Pre FEV1/FVC % % 84   Post FEV1/FCV % % 87   FEV1-Pre L 4.21   FEV1-Predicted Pre % 81   FEV1-Post L 4.36   DLCO uncorrected ml/min/mmHg 31.37   DLCO UNC% % 85   DLVA Predicted % 108   TLC L 6.85   TLC % Predicted % 90   RV % Predicted % 138     Outpatient Medications Prior to Visit  Medication Sig Dispense Refill    loratadine (CLARITIN) 10 MG tablet Take 1 tablet (10 mg total) by mouth daily. 30 tablet 11   No facility-administered medications prior to visit.

## 2024-06-07 NOTE — Telephone Encounter (Signed)
 Danny Best has the referral been sent?

## 2024-06-07 NOTE — Patient Instructions (Addendum)
 Location of our Adult Cystic Fibrosis Center [facility] [address] [place], [place] [address]  Phone: [phone] Fax: [phone]     How to contact the CF Team If you have CF and are interested in scheduling an appointment with our team, we do not require a referral to be seen in our office. However, please contact your insurance company to inquire if a referral is needed from their standpoint.   If you are an established CF patient with our Center and need to schedule a follow-up appointment or have an urgent need, please reach out to our CF team during business hours of 8 a.m. to 4:30 p.m. at [phone] and ask to speak with a CF nurse.  MyChart can also be used for established patients with questions for the CF Team. If you are unable to find the CF team member you need to message in MyChart, send the message to your CF doctor and it will get routed to the correct multidisciplinary team member.    [email or web address]

## 2024-06-07 NOTE — Telephone Encounter (Signed)
 Copied from CRM [id number]. Topic: Referral - Status >> Jun 07, 2024 12:21 PM Russell PARAS wrote: Reason for CRM:   Pt's mother is contacting clinic regarding referral sent by [facility] to [facility]. Their clinic is not showing referral in system and they need it before he can be scheduled.   Provided fax # [phone], put Attn: Dr Kayla  CB#  [phone] >> Jun 07, 2024  2:51 PM Rozanna MATSU wrote: Pt mom calling again stated she spoke with [facility] and they are asking to fax the referral since theyare trying to get an appt as soon as possible. Fax [phone] to [facility] for Dr Gara Door >> Jun 07, 2024  2:35 PM Rozanna MATSU wrote: Pt mom calling back stated [facility] said they didn't have referral yet advised her it was sent at 1230 today. I provided her with the referral number.  >> Jun 07, 2024  2:16 PM Rozanna G wrote: Pt mom called back advised her that referral was sent to [facility] to Dr Gara Door per referral tab

## 2024-06-07 NOTE — Telephone Encounter (Signed)
 They are requesting a 2nd referral to the CF center that [facility] has in [place] to see where they are able to get seen quicker

## 2024-06-07 NOTE — Telephone Encounter (Signed)
 Per pt request, second referral has been created.

## 2024-06-08 NOTE — Telephone Encounter (Unsigned)
 Copied from CRM [id number]. Topic: Referral - Question >> Jun 08, 2024  3:11 PM Isabell A wrote: Reason for CRM: Patients mom states Dr.Howards office is requesting for the referral to be re-faxed.  Fax number: [phone]

## 2024-06-09 NOTE — Telephone Encounter (Signed)
 Patient advised. NFN.

## 2024-06-09 NOTE — Telephone Encounter (Signed)
 I have refaxed the order again to [facility]. [phone]. Both referrals were faxed on 06/08/24. I had to wait for Dr. Clydene note to be completed

## 2024-06-12 NOTE — Addendum Note (Signed)
 Addended by: Robin Petrakis J on: 06/12/2024 11:36 AM   Modules accepted: Orders

## 2024-06-12 NOTE — Telephone Encounter (Unsigned)
 Copied from CRM [id number]. Topic: Referral - Status >> Jun 12, 2024 10:13 AM Ismael A wrote: Reason for CRM: patient's Mother called in stating that referral was sent Dr. Laurence Plover reference# [id number] - states the referral was sent stating the patient needs a cystic fibrosis doctor but Dr. Plover does not specialize in cystic fibrosis - if doctor preference Dr. Plover the referral would need to exclude cystic fibrosis or be sent to a doctor who specializes in this disease, per pt's mother she is requesting Dr. Gala Taras Lash - patient's mother is requesting a call back asap ph# [phone]

## 2024-06-13 NOTE — Telephone Encounter (Signed)
 Printed and available at the front desk.

## 2024-06-13 NOTE — Telephone Encounter (Unsigned)
 Copied from CRM [id number]. Topic: Referral - Question >> Jun 13, 2024 11:10 AM Isabell A wrote: Reason for CRM: Patients mother states Hildegard, Gala Chess, MD has not received the referral - requesting for us  to fax the referral over & give her a call back once sent.   Patients mom is also requesting a paper copy of the referral - would like to pick this up today.    Callback number: [phone]

## 2024-06-14 NOTE — Telephone Encounter (Incomplete)
 Spoke with evelyn, mom, concerning referral to Dr Hildegard. Mom still very frustrated why [facility] had not gotten referral for the pt if we sent this ddddd      . Offered to call [facility] and see what I could find out. R Hildegard does not see this pt's dx.  Mom thoiught this odd, as she said internet put him as the best for this dx. Spoke w/ Kizzi at [employer].  She is the one who prided the information to me.  Kizzi also said it takes 5-7 business days for a get into the system.

## 2024-06-14 NOTE — Telephone Encounter (Signed)
 Patient mom is calling to speak with someone concerning the referral for patient. They feel like there is a disconnect transferred to ms kim

## 2024-06-14 NOTE — Telephone Encounter (Signed)
 Pt's mother calling concerning the referral for 2nd opinion. The referral to see Dr. Hildegard is not being handled how she would like. The office is trying to schedule with Dr. Arlyss they don't want to see Dr. Arlyss. Is asking for some assistance. We have sent referral in multiple times. Wondering if Dgayli can reach out peer to peer.

## 2024-06-14 NOTE — Telephone Encounter (Unsigned)
 Copied from CRM [id number]. Topic: Referral - Question >> Jun 14, 2024 11:46 AM Leila C wrote: Patient's mother Hargis Pinal [phone], states Dr. Hildegard at [facility] does not have records faxed from Dr. Isadora per patient scheduling. Hargis is very upset and frustrated, wants to speak with someone. Hargis does not want patient to see Dr. Arlyss, the referral is for Dr. Hildegard. Per CAL, Donzell is not available and will call back today.  >> Jun 14, 2024  8:46 AM Isabell A wrote: Mother calling - spoke with Melissa who will contact [facility] & reach out to mom.

## 2024-06-15 NOTE — Telephone Encounter (Signed)
 Noted. Nothing further needed.

## 2024-07-03 ENCOUNTER — Telehealth: Payer: Self-pay | Admitting: Student in an Organized Health Care Education/Training Program

## 2024-07-03 NOTE — Telephone Encounter (Signed)
 Copied from CRM #[id number]. Topic: General - Other >> Jul 03, 2024  1:42 PM Rozanna G wrote: Reason for CRM: Pt is faxing over forms for accomodation for Dr Isadora to fill out and return back to the school the information where to send back will be on the form.

## 2024-07-10 NOTE — Telephone Encounter (Signed)
Paperwork is in your folder.

## 2024-07-10 NOTE — Telephone Encounter (Signed)
 Forms has been filled out and faxed back.  Nothing further needed.

## 2024-07-10 NOTE — Telephone Encounter (Signed)
 Disability documentation form from [facility] placed in Dr. Clydene folder

## 2024-08-15 ENCOUNTER — Telehealth: Payer: Self-pay

## 2024-08-15 NOTE — Telephone Encounter (Signed)
 Copied from CRM [id number]. Topic: Referral - Question >> Aug 15, 2024 10:22 AM Donzell ORN wrote: Patient was senn 06/22/24 at [facility] by Marsa Molly. Patient was seend 08/02/24 at [facility] by Herlene Martinis
